# Patient Record
Sex: Female | Born: 1975 | Race: White | Hispanic: No | Smoking: Never smoker
Health system: Southern US, Community
[De-identification: ages and names within clinical notes are randomized; demographics above are authoritative.]

## PROBLEM LIST (undated history)

## (undated) DIAGNOSIS — I10 Essential (primary) hypertension: Secondary | ICD-10-CM

## (undated) DIAGNOSIS — G5602 Carpal tunnel syndrome, left upper limb: Secondary | ICD-10-CM

## (undated) DIAGNOSIS — B001 Herpesviral vesicular dermatitis: Secondary | ICD-10-CM

## (undated) HISTORY — DX: Carpal tunnel syndrome, left upper limb: G56.02

## (undated) HISTORY — DX: Essential (primary) hypertension: I10

## (undated) HISTORY — DX: Herpesviral vesicular dermatitis: B00.1

## (undated) HISTORY — PX: CARPAL TUNNEL RELEASE: SHX101

---

## 1997-08-04 ENCOUNTER — Other Ambulatory Visit: Admission: RE | Admit: 1997-08-04 | Discharge: 1997-08-04 | Payer: Self-pay | Admitting: *Deleted

## 1998-08-10 ENCOUNTER — Other Ambulatory Visit: Admission: RE | Admit: 1998-08-10 | Discharge: 1998-08-10 | Payer: Self-pay | Admitting: Obstetrics and Gynecology

## 1999-11-28 ENCOUNTER — Other Ambulatory Visit: Admission: RE | Admit: 1999-11-28 | Discharge: 1999-11-28 | Payer: Self-pay | Admitting: Obstetrics and Gynecology

## 2001-01-10 ENCOUNTER — Other Ambulatory Visit: Admission: RE | Admit: 2001-01-10 | Discharge: 2001-01-10 | Payer: Self-pay | Admitting: Obstetrics and Gynecology

## 2002-01-23 ENCOUNTER — Other Ambulatory Visit: Admission: RE | Admit: 2002-01-23 | Discharge: 2002-01-23 | Payer: Self-pay | Admitting: Obstetrics and Gynecology

## 2003-01-30 ENCOUNTER — Other Ambulatory Visit: Admission: RE | Admit: 2003-01-30 | Discharge: 2003-01-30 | Payer: Self-pay | Admitting: Obstetrics and Gynecology

## 2004-04-05 ENCOUNTER — Other Ambulatory Visit: Admission: RE | Admit: 2004-04-05 | Discharge: 2004-04-05 | Payer: Self-pay | Admitting: Obstetrics and Gynecology

## 2005-04-14 ENCOUNTER — Other Ambulatory Visit: Admission: RE | Admit: 2005-04-14 | Discharge: 2005-04-14 | Payer: Self-pay | Admitting: Obstetrics and Gynecology

## 2009-06-29 ENCOUNTER — Inpatient Hospital Stay (HOSPITAL_COMMUNITY): Admission: AD | Admit: 2009-06-29 | Discharge: 2009-07-02 | Payer: Self-pay | Admitting: Obstetrics and Gynecology

## 2010-06-08 LAB — CBC
HCT: 28.8 % — ABNORMAL LOW (ref 36.0–46.0)
HCT: 34.8 % — ABNORMAL LOW (ref 36.0–46.0)
Hemoglobin: 11.7 g/dL — ABNORMAL LOW (ref 12.0–15.0)
MCV: 86.9 fL (ref 78.0–100.0)
Platelets: 291 10*3/uL (ref 150–400)
RBC: 3.31 MIL/uL — ABNORMAL LOW (ref 3.87–5.11)
WBC: 15 10*3/uL — ABNORMAL HIGH (ref 4.0–10.5)
WBC: 15.1 10*3/uL — ABNORMAL HIGH (ref 4.0–10.5)

## 2010-06-08 LAB — COMPREHENSIVE METABOLIC PANEL
BUN: 8 mg/dL (ref 6–23)
Chloride: 106 mEq/L (ref 96–112)
GFR calc non Af Amer: >60 mL/min (ref >60)
Glucose, Bld: 71 mg/dL (ref 70–99)
Potassium: 3.9 mEq/L (ref 3.5–5.1)

## 2010-06-08 LAB — RPR: RPR Ser Ql: NONREACTIVE

## 2014-06-25 ENCOUNTER — Other Ambulatory Visit: Payer: Self-pay | Admitting: Obstetrics and Gynecology

## 2014-06-25 DIAGNOSIS — R928 Other abnormal and inconclusive findings on diagnostic imaging of breast: Secondary | ICD-10-CM

## 2014-07-02 ENCOUNTER — Ambulatory Visit
Admission: RE | Admit: 2014-07-02 | Discharge: 2014-07-02 | Disposition: A | Discharge status: Home / Self Care | Payer: Federal, State, Local not specified - PPO | Source: Ambulatory Visit | Attending: Obstetrics and Gynecology | Admitting: Obstetrics and Gynecology

## 2014-07-02 DIAGNOSIS — R928 Other abnormal and inconclusive findings on diagnostic imaging of breast: Secondary | ICD-10-CM

## 2017-07-23 ENCOUNTER — Other Ambulatory Visit: Payer: Self-pay | Admitting: Obstetrics and Gynecology

## 2017-07-23 DIAGNOSIS — R928 Other abnormal and inconclusive findings on diagnostic imaging of breast: Secondary | ICD-10-CM

## 2017-07-24 ENCOUNTER — Ambulatory Visit
Admission: RE | Admit: 2017-07-24 | Discharge: 2017-07-24 | Disposition: A | Discharge status: Home / Self Care | Payer: Federal, State, Local not specified - PPO | Source: Ambulatory Visit | Attending: Obstetrics and Gynecology | Admitting: Obstetrics and Gynecology

## 2017-07-24 DIAGNOSIS — R928 Other abnormal and inconclusive findings on diagnostic imaging of breast: Secondary | ICD-10-CM

## 2019-02-26 ENCOUNTER — Ambulatory Visit: Payer: Federal, State, Local not specified - PPO | Admitting: Neurology

## 2019-02-26 ENCOUNTER — Other Ambulatory Visit: Payer: Self-pay

## 2019-02-26 ENCOUNTER — Ambulatory Visit (INDEPENDENT_AMBULATORY_CARE_PROVIDER_SITE_OTHER): Payer: Federal, State, Local not specified - PPO | Admitting: Neurology

## 2019-02-26 ENCOUNTER — Encounter: Payer: Self-pay | Admitting: Neurology

## 2019-02-26 DIAGNOSIS — G5601 Carpal tunnel syndrome, right upper limb: Secondary | ICD-10-CM

## 2019-02-26 HISTORY — DX: Carpal tunnel syndrome, right upper limb: G56.01

## 2019-02-26 NOTE — Procedures (Signed)
     HISTORY:  Jeanette Todd is a 43 year old patient with a several year history of some numbness in the hands that may wake her up at night.  She has symptoms on both sides, a bit more significant on the right than the left.  She does have a history of chronic neck pain and neck stiffness.  She denies any radicular pain down the arm on either side.  She is being evaluated for possible neuropathy or a cervical radiculopathy.  NERVE CONDUCTION STUDIES:  Nerve conduction studies were performed on both upper extremities.  The distal motor latencies for the median nerves were normal on the left and borderline normal on the right with normal motor amplitudes for these nerves bilaterally.  The distal motor latencies and motor amplitudes for the ulnar nerves were normal bilaterally with normal nerve conduction velocities seen for the median and ulnar nerves bilaterally.  The sensory latencies for the median nerves were prolonged on the right, normal on the left and normal for the ulnar nerves bilaterally.  The F-wave latencies for the ulnar nerves were normal bilaterally.  EMG STUDIES:  EMG study was performed on the left upper extremity:  The first dorsal interosseous muscle reveals 2 to 4 K units with full recruitment. No fibrillations or positive waves were noted. The abductor pollicis brevis muscle reveals 2 to 4 K units with full recruitment. No fibrillations or positive waves were noted. The extensor indicis proprius muscle reveals 1 to 3 K units with full recruitment. No fibrillations or positive waves were noted. The pronator teres muscle reveals 2 to 3 K units with full recruitment. No fibrillations or positive waves were noted. The biceps muscle reveals 1 to 2 K units with full recruitment. No fibrillations or positive waves were noted. The triceps muscle reveals 2 to 4 K units with full recruitment. No fibrillations or positive waves were noted. The anterior deltoid muscle reveals 2 to 3 K  units with full recruitment. No fibrillations or positive waves were noted. The cervical paraspinal muscles were tested at 2 levels. No abnormalities of insertional activity were seen at either level tested. There was fair relaxation.   IMPRESSION:  Nerve conduction studies done on both upper extremities shows evidence of a mild/borderline right carpal tunnel syndrome.  The study on the left upper extremity was unremarkable.  EMG evaluation of the left upper extremity shows no significant abnormalities, there is no evidence of an overlying cervical radiculopathy.  Jill Alexanders MD 02/26/2019 4:14 PM  [facility] [address] [place], [place] [address]  Phone [phone] Fax [phone]

## 2019-02-26 NOTE — Progress Notes (Signed)
Please refer to EMG and nerve conduction procedure note.  

## 2021-01-13 ENCOUNTER — Ambulatory Visit (HOSPITAL_BASED_OUTPATIENT_CLINIC_OR_DEPARTMENT_OTHER): Payer: Federal, State, Local not specified - PPO | Admitting: Cardiovascular Disease

## 2021-01-13 ENCOUNTER — Encounter (HOSPITAL_BASED_OUTPATIENT_CLINIC_OR_DEPARTMENT_OTHER): Payer: Self-pay | Admitting: Cardiovascular Disease

## 2021-01-13 ENCOUNTER — Other Ambulatory Visit: Payer: Self-pay

## 2021-01-13 DIAGNOSIS — I10 Essential (primary) hypertension: Secondary | ICD-10-CM

## 2021-01-13 DIAGNOSIS — R002 Palpitations: Secondary | ICD-10-CM | POA: Diagnosis not present

## 2021-01-13 DIAGNOSIS — Z8249 Family history of ischemic heart disease and other diseases of the circulatory system: Secondary | ICD-10-CM | POA: Diagnosis not present

## 2021-01-13 HISTORY — DX: Palpitations: R00.2

## 2021-01-13 HISTORY — DX: Essential (primary) hypertension: I10

## 2021-01-13 NOTE — Assessment & Plan Note (Addendum)
Symptoms were worse prior to adding lisinopril.  Given that she has no lung symptoms, we will not get an ambulatory monitor at this time.  We will reassess if her episodes become more frequent again.  Labs from her PCP were reviewed and unremarkable.  Therefore no plans to repeat at this time.

## 2021-01-13 NOTE — Progress Notes (Signed)
Cardiology Office Note:    Date:  01/18/2021   ID:  Jeanette Todd, DOB 11/06/1975, MRN [id number]  PCP:  Mitzi Hansen, NP   [facility] Providers Cardiologist:  None     Referring MD: Mitzi Hansen, NP   No chief complaint on file.   History of Present Illness:    Jeanette Todd is a 45 y.o. female with a hx of anxiety, and hypertension, here for evaluation of palpitations.  She saw Cannon Kettle, NP on 10/2020 and reported episodes of palpitaions. At that time her BP 154/103. Lisinopril was added to her regimen, and she was referred to cardiology.  Today, she reports having palpitations and shortness of breath that she attributes to her prior COVID infection. Her palpitations occur randomly at rest or with exertion. Does not check blood pressure everyday, but when she does it is generally stable. Since her medication changes 2 months ago, her blood pressure has improved with averages around 120/90, and her palpitations have not recurred. Additionally, she notes improvement in her ankle edema since beginning lisinopril. She reports having random shortness of breath that does not necessarily occur with exercise. It does usually occur by the time she climbs a flight of stairs. Has not been able to exercise due to her severe lower back pain. Used to do yoga, and enjoys walking and hiking, but dislikes the gym. She does not smoke. For her diet, she reports eating very well, and avoids salt. Does not drink soft drinks or tea/coffee. She denies any chest pain. No lightheadedness, headaches, orthopnea, or PND.    Past Medical History:  Diagnosis Date   Essential hypertension 01/13/2021   Family history of premature CAD 01/18/2021   HTN (hypertension)    Left carpal tunnel syndrome    Palpitations 01/13/2021   Right carpal tunnel syndrome 02/26/2019    History reviewed. No pertinent surgical history.  Current Medications: Current Meds  Medication Sig   hydrochlorothiazide (HYDRODIURIL)  25 MG tablet Take 1 tablet by mouth daily.   levonorgestrel (MIRENA, 52 MG,) 20 MCG/DAY IUD Mirena 20 mcg/24 hours (8 yrs) 52 mg intrauterine device  Take 1 device by intrauterine route.   lisinopril (ZESTRIL) 10 MG tablet Take 10 mg by mouth daily.   LITHIUM OROTATE PO Take 1 tablet by mouth daily.   Probiotic Product (PROBIOTIC DAILY PO) Take 2 tablets by mouth daily.     Allergies:   Patient has no allergy information on record.   Social History   Socioeconomic History   Marital status: Married    Spouse name: Not on file   Number of children: Not on file   Years of education: Not on file   Highest education level: Not on file  Occupational History   Not on file  Tobacco Use   Smoking status: Never   Smokeless tobacco: Never  Substance and Sexual Activity   Alcohol use: Yes    Comment: Occassionally   Drug use: Never   Sexual activity: Not on file  Other Topics Concern   Not on file  Social History Narrative   Not on file   Social Determinants of Health   Financial Resource Strain: Low Risk    Difficulty of Paying Living Expenses: Not hard at all  Food Insecurity: No Food Insecurity   Worried About Running Out of Food in the Last Year: Never true   Ran Out of Food in the Last Year: Never true  Transportation Needs: No Transportation Needs   Lack  of Transportation (Medical): No   Lack of Transportation (Non-Medical): No  Physical Activity: Inactive   Days of Exercise per Week: 0 days   Minutes of Exercise per Session: 0 min  Stress: Not on file  Social Connections: Not on file     Family History: The patient's family history includes Coronary artery disease in her father; Diabetes in her father; Heart attack in her paternal grandfather; Heart disease in her father, maternal grandfather, and maternal grandmother; Hypertension in her mother.  ROS:   Please see the history of present illness.    (+) Shortness of breath (+) Palpitations (+) Lower back pain All  other systems reviewed and are negative.  EKGs/Labs/Other Studies Reviewed:    The following studies were reviewed today: No prior cardiovascular studies available.  EKG:    01/13/2021: Sinus rhythm. Rate 85 bpm.  Recent Labs: No results found for requested labs within last 8760 hours.  Recent Lipid Panel No results found for: CHOL, TRIG, HDL, CHOLHDL, VLDL, LDLCALC, LDLDIRECT   Physical Exam:    Wt Readings from Last 3 Encounters:  01/13/21 188 lb 8 oz (85.5 kg)     VS:  BP 118/81 (BP Location: Right Arm, Patient Position: Sitting, Cuff Size: Large)   Pulse 85   Ht 5\' 6"  (1.676 m)   Wt 188 lb 8 oz (85.5 kg)   BMI 30.42 kg/m  , BMI Body mass index is 30.42 kg/m. GENERAL:  Well appearing HEENT: Pupils equal round and reactive, fundi not visualized, oral mucosa unremarkable NECK:  No jugular venous distention, waveform within normal limits, carotid upstroke brisk and symmetric, no bruits, no thyromegaly LUNGS:  Clear to auscultation bilaterally HEART:  RRR.  PMI not displaced or sustained,S1 and S2 within normal limits, no S3, no S4, no clicks, no rubs, no murmurs ABD:  Flat, positive bowel sounds normal in frequency in pitch, no bruits, no rebound, no guarding, no midline pulsatile mass, no hepatomegaly, no splenomegaly EXT:  2 plus pulses throughout, no edema, no cyanosis no clubbing SKIN:  No rashes no nodules NEURO:  Cranial nerves II through XII grossly intact, motor grossly intact throughout PSYCH:  Cognitively intact, oriented to person place and time   ASSESSMENT:    1. Essential hypertension   2. Palpitations   3. Family history of premature CAD    PLAN:    Essential hypertension BP better since adding lisinopril to HCTZ.  Palpitations Symptoms were worse prior to adding lisinopril.  Given that she has no lung symptoms, we will not get an ambulatory monitor at this time.  We will reassess if her episodes become more frequent again.  Labs from her PCP were  reviewed and unremarkable.  Therefore no plans to repeat at this time.  Family history of premature CAD She is interested in getting a coronary calcium score.     Disposition: FU with Courteny Egler C. , MD, [facility]  PRN.  Medication Adjustments/Labs and Tests Ordered: Current medicines are reviewed at length with the patient today.  Concerns regarding medicines are outlined above.   Orders Placed This Encounter  Procedures   CT CARDIAC SCORING (SELF PAY ONLY)   EKG 12-Lead    No orders of the defined types were placed in this encounter.   Patient Instructions  Medication Instructions:  Your physician recommends that you continue on your current medications as directed. Please refer to the Current Medication list given to you today.  Labwork: NONE  Testing/Procedures: CALCIUM SCORE- THIS WILL COST $99  OUT OF POCKET  Follow-Up: AS NEEDED          I,Mathew Stumpf,acting as a scribe for Chilton Si, MD.,have documented all relevant documentation on the behalf of Chilton Si, MD,as directed by  Chilton Si, MD while in the presence of Chilton Si, MD.   I, Nichoel Digiulio C. Duke Salvia, MD have reviewed all documentation for this visit.  The documentation of the exam, diagnosis, procedures, and orders on 01/18/2021 are all accurate and complete.   Signed, Chilton Si, MD  01/18/2021 3:27 PM    [facility]

## 2021-01-13 NOTE — Assessment & Plan Note (Signed)
BP better since adding lisinopril to HCTZ.

## 2021-01-13 NOTE — Patient Instructions (Signed)
Medication Instructions:  Your physician recommends that you continue on your current medications as directed. Please refer to the Current Medication list given to you today.  Labwork: NONE  Testing/Procedures: CALCIUM SCORE- THIS WILL COST $99 OUT OF POCKET  Follow-Up: AS NEEDED

## 2021-01-17 ENCOUNTER — Encounter (HOSPITAL_BASED_OUTPATIENT_CLINIC_OR_DEPARTMENT_OTHER): Payer: Self-pay

## 2021-01-18 ENCOUNTER — Encounter (HOSPITAL_BASED_OUTPATIENT_CLINIC_OR_DEPARTMENT_OTHER): Payer: Self-pay | Admitting: Cardiovascular Disease

## 2021-01-18 DIAGNOSIS — Z8249 Family history of ischemic heart disease and other diseases of the circulatory system: Secondary | ICD-10-CM | POA: Insufficient documentation

## 2021-01-18 HISTORY — DX: Family history of ischemic heart disease and other diseases of the circulatory system: Z82.49

## 2021-01-18 NOTE — Assessment & Plan Note (Signed)
She is interested in getting a coronary calcium score.

## 2021-02-09 ENCOUNTER — Ambulatory Visit
Admission: RE | Admit: 2021-02-09 | Discharge: 2021-02-09 | Disposition: A | Discharge status: Home / Self Care | Payer: Self-pay | Source: Ambulatory Visit | Attending: Cardiovascular Disease | Admitting: Cardiovascular Disease

## 2021-02-09 ENCOUNTER — Other Ambulatory Visit: Payer: Self-pay

## 2021-02-09 DIAGNOSIS — R002 Palpitations: Secondary | ICD-10-CM

## 2021-02-09 DIAGNOSIS — I10 Essential (primary) hypertension: Secondary | ICD-10-CM

## 2021-04-26 DIAGNOSIS — N393 Stress incontinence (female) (male): Secondary | ICD-10-CM | POA: Diagnosis not present

## 2021-04-26 DIAGNOSIS — M6289 Other specified disorders of muscle: Secondary | ICD-10-CM | POA: Diagnosis not present

## 2021-04-26 DIAGNOSIS — M6281 Muscle weakness (generalized): Secondary | ICD-10-CM | POA: Diagnosis not present

## 2021-07-18 DIAGNOSIS — I10 Essential (primary) hypertension: Secondary | ICD-10-CM | POA: Diagnosis not present

## 2021-07-18 DIAGNOSIS — M545 Low back pain, unspecified: Secondary | ICD-10-CM | POA: Diagnosis not present

## 2021-10-20 DIAGNOSIS — Z23 Encounter for immunization: Secondary | ICD-10-CM | POA: Diagnosis not present

## 2021-10-20 DIAGNOSIS — Z13220 Encounter for screening for lipoid disorders: Secondary | ICD-10-CM | POA: Diagnosis not present

## 2021-10-20 DIAGNOSIS — Z0000 Encounter for general adult medical examination without abnormal findings: Secondary | ICD-10-CM | POA: Diagnosis not present

## 2022-01-12 DIAGNOSIS — K648 Other hemorrhoids: Secondary | ICD-10-CM | POA: Diagnosis not present

## 2022-01-12 DIAGNOSIS — Z1211 Encounter for screening for malignant neoplasm of colon: Secondary | ICD-10-CM | POA: Diagnosis not present

## 2022-01-12 DIAGNOSIS — D123 Benign neoplasm of transverse colon: Secondary | ICD-10-CM | POA: Diagnosis not present

## 2022-01-31 ENCOUNTER — Ambulatory Visit (INDEPENDENT_AMBULATORY_CARE_PROVIDER_SITE_OTHER): Payer: Federal, State, Local not specified - PPO | Admitting: Radiology

## 2022-01-31 ENCOUNTER — Encounter: Payer: Self-pay | Admitting: Radiology

## 2022-01-31 VITALS — BP 114/84 | Ht 65.5 in | Wt 179.0 lb

## 2022-01-31 DIAGNOSIS — Z01419 Encounter for gynecological examination (general) (routine) without abnormal findings: Secondary | ICD-10-CM | POA: Diagnosis not present

## 2022-01-31 DIAGNOSIS — Z30431 Encounter for routine checking of intrauterine contraceptive device: Secondary | ICD-10-CM

## 2022-01-31 NOTE — Progress Notes (Signed)
   Jeanette Todd Dec 17, 1975 [id number]   History:  46 y.o. G1P1 presents for annual exam. No gyn concerns.   Gynecologic History No LMP recorded. (Menstrual status: IUD).   Contraception/Family planning: IUD Sexually active: yes Last Pap: 2019. Results were: normal Last mammogram: 11/22. Results were: normal  Obstetric History OB History  Gravida Para Term Preterm AB Living  1 1       1   SAB IAB Ectopic Multiple Live Births               # Outcome Date GA Lbr Len/2nd Weight Sex Delivery Anes PTL Lv  1 Para              The following portions of the patient's history were reviewed and updated as appropriate: allergies, current medications, past family history, past medical history, past social history, past surgical history, and problem list.  Review of Systems Pertinent items noted in HPI and remainder of comprehensive ROS otherwise negative.   Past medical history, past surgical history, family history and social history were all reviewed and documented in the EPIC chart.   Exam:  Vitals:   01/31/22 0828  BP: 114/84  Weight: 179 lb (81.2 kg)  Height: 5' 5.5" (1.664 m)   Body mass index is 29.33 kg/m.  General appearance:  Normal Thyroid:  Symmetrical, normal in size, without palpable masses or nodularity. Respiratory  Auscultation:  Clear without wheezing or rhonchi Cardiovascular  Auscultation:  Regular rate, without rubs, murmurs or gallops  Edema/varicosities:  Not grossly evident Abdominal  Soft,nontender, without masses, guarding or rebound.  Liver/spleen:  No organomegaly noted  Hernia:  None appreciated  Skin  Inspection:  Grossly normal Breasts: Examined lying and sitting.   Right: Without masses, retractions, nipple discharge or axillary adenopathy.   Left: Without masses, retractions, nipple discharge or axillary adenopathy. Genitourinary   Inguinal/mons:  Normal without inguinal adenopathy  External genitalia:  Normal appearing vulva with no  masses, tenderness, or lesions  BUS/Urethra/Skene's glands:  Normal without masses or exudate  Vagina:  Normal appearing with normal color and discharge, no lesions  Cervix:  Normal appearing without discharge or lesions  Uterus:  Normal in size, shape and contour.  Mobile, nontender  Adnexa/parametria:     Rt: Normal in size, without masses or tenderness.   Lt: Normal in size, without masses or tenderness.  Anus and perineum: Normal, hemorrhoids   Patient informed chaperone available to be present for breast and pelvic exam. Patient has requested no chaperone to be present. Patient has been advised what will be completed during breast and pelvic exam.   Assessment/Plan:   1. Well woman exam with routine gynecological exam Pap due 2024  2. IUD check up Reassured strings seen    Discussed SBE, colonoscopy and DEXA screening as directed/appropriate. Recommend 2025 of exercise weekly, including weight bearing exercise. Encouraged the use of seatbelts and sunscreen. Return in 1 year for annual or as needed.   WHNP-BC 8:49 AM 01/31/2022

## 2022-02-01 ENCOUNTER — Other Ambulatory Visit: Payer: Self-pay | Admitting: Radiology

## 2022-02-01 DIAGNOSIS — Z1231 Encounter for screening mammogram for malignant neoplasm of breast: Secondary | ICD-10-CM

## 2022-03-22 ENCOUNTER — Ambulatory Visit
Admission: RE | Admit: 2022-03-22 | Discharge: 2022-03-22 | Disposition: A | Discharge status: Home / Self Care | Payer: Federal, State, Local not specified - PPO | Source: Ambulatory Visit | Attending: Radiology | Admitting: Radiology

## 2022-03-22 DIAGNOSIS — Z1231 Encounter for screening mammogram for malignant neoplasm of breast: Secondary | ICD-10-CM | POA: Diagnosis not present

## 2022-04-28 DIAGNOSIS — J069 Acute upper respiratory infection, unspecified: Secondary | ICD-10-CM | POA: Diagnosis not present

## 2022-10-25 DIAGNOSIS — E785 Hyperlipidemia, unspecified: Secondary | ICD-10-CM | POA: Diagnosis not present

## 2022-10-25 DIAGNOSIS — I10 Essential (primary) hypertension: Secondary | ICD-10-CM | POA: Diagnosis not present

## 2022-10-25 DIAGNOSIS — Z0000 Encounter for general adult medical examination without abnormal findings: Secondary | ICD-10-CM | POA: Diagnosis not present

## 2023-02-06 ENCOUNTER — Encounter: Payer: Self-pay | Admitting: Radiology

## 2023-02-06 ENCOUNTER — Other Ambulatory Visit (HOSPITAL_COMMUNITY)
Admission: RE | Admit: 2023-02-06 | Discharge: 2023-02-06 | Disposition: A | Discharge status: Home / Self Care | Payer: Federal, State, Local not specified - PPO | Source: Ambulatory Visit | Attending: Radiology | Admitting: Radiology

## 2023-02-06 ENCOUNTER — Ambulatory Visit (INDEPENDENT_AMBULATORY_CARE_PROVIDER_SITE_OTHER): Payer: Federal, State, Local not specified - PPO | Admitting: Radiology

## 2023-02-06 VITALS — BP 116/82 | Ht 65.0 in | Wt 152.0 lb

## 2023-02-06 DIAGNOSIS — Z01419 Encounter for gynecological examination (general) (routine) without abnormal findings: Secondary | ICD-10-CM

## 2023-02-06 DIAGNOSIS — B009 Herpesviral infection, unspecified: Secondary | ICD-10-CM | POA: Diagnosis not present

## 2023-02-06 DIAGNOSIS — F419 Anxiety disorder, unspecified: Secondary | ICD-10-CM

## 2023-02-06 DIAGNOSIS — K648 Other hemorrhoids: Secondary | ICD-10-CM | POA: Diagnosis not present

## 2023-02-06 MED ORDER — HYDROCORTISONE ACETATE 25 MG RE SUPP: 25.0000 mg | Freq: Two times a day (BID) | RECTAL | 4 refills | Status: AC | PRN

## 2023-02-06 MED ORDER — VALACYCLOVIR HCL 500 MG PO TABS: 500.0000 mg | ORAL_TABLET | Freq: Every day | ORAL | 4 refills | Status: DC

## 2023-02-06 NOTE — Progress Notes (Signed)
Jeanette Todd [birth year] [id number]   History:  47 y.o. G1P1 presents for annual exam. Legally separated, has a new sexual partner, declines STI screen. C/o worsening oral HSV outbreaks, worse during stressful times. Also has a hemorrhoid that flares on and off. Denies any constipation or straining.  Gynecologic History No LMP recorded. (Menstrual status: IUD).   Contraception/Family planning: IUD Mirena placed 2022 Sexually active: yes Last Pap: 2019. Results were: normal Last mammogram: 1/24. Results were: normal  Obstetric History OB History  Gravida Para Term Preterm AB Living  1 1       1   SAB IAB Ectopic Multiple Live Births               # Outcome Date GA Lbr Len/2nd Weight Sex Type Anes PTL Lv  1 Para             The following portions of the patient's history were reviewed and updated as appropriate: allergies, current medications, past family history, past medical history, past social history, past surgical history, and problem list.  Review of Systems  All other systems reviewed and are negative.   Past medical history, past surgical history, family history and social history were all reviewed and documented in the EPIC chart.  Exam:  Vitals:   02/06/23 0959  BP: 116/82  Weight: 152 lb (68.9 kg)  Height: 5\' 5"  (1.651 m)   Body mass index is 25.29 kg/m.  Physical Exam Vitals and nursing note reviewed. Exam conducted with a chaperone present.  Constitutional:      Appearance: Normal appearance. She is normal weight.  HENT:     Head: Normocephalic and atraumatic.  Neck:     Thyroid: No thyroid mass, thyromegaly or thyroid tenderness.  Cardiovascular:     Rate and Rhythm: Regular rhythm.     Heart sounds: Normal heart sounds.  Pulmonary:     Effort: Pulmonary effort is normal.     Breath sounds: Normal breath sounds.  Chest:  Breasts:    Breasts are symmetrical.     Right: Normal. No inverted nipple, mass, nipple discharge, skin change or  tenderness.     Left: Normal. No inverted nipple, mass, nipple discharge, skin change or tenderness.  Abdominal:     General: Abdomen is flat. Bowel sounds are normal.     Palpations: Abdomen is soft.  Genitourinary:    General: Normal vulva.     Vagina: Normal. No vaginal discharge, bleeding or lesions.     Cervix: Normal. No discharge or lesion.     Uterus: Normal. Not enlarged and not tender.      Adnexa: Right adnexa normal and left adnexa normal.       Right: No mass, tenderness or fullness.         Left: No mass, tenderness or fullness.       Rectum: External hemorrhoid and internal hemorrhoid present. No tenderness.  Lymphadenopathy:     Upper Body:     Right upper body: No axillary adenopathy.     Left upper body: No axillary adenopathy.  Skin:    General: Skin is warm and dry.  Neurological:     Mental Status: She is alert and oriented to person, place, and time.  Psychiatric:        Mood and Affect: Mood normal.        Thought Content: Thought content normal.        Judgment: Judgment normal.      Raynelle Fanning,  CMA present for exam  Assessment/Plan:   1. Well woman exam with routine gynecological exam - Cytology - PAP( [place])  2. HSV-1 infection - valACYclovir (VALTREX) 500 MG tablet; Take 1 tablet (500 mg total) by mouth daily.  Dispense: 90 tablet; Refill: 4  3. Hemorrhoid prolapse - hydrocortisone (ANUSOL-HC) 25 MG suppository; Place 1 suppository (25 mg total) rectally 2 (two) times daily as needed for hemorrhoids or anal itching.  Dispense: 12 suppository; Refill: 4  4. Anxiety Increased anxiety, will continue to monitor. 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 Declines medication at this time. May benefit from working remotely full time.     Discussed SBE, pap and mammogram screening as directed/appropriate. Recommend of exercise weekly, including weight bearing exercise. Encouraged the use of sunscreen and seeing  a dermatologist for a skin check.  Return in about 1 year (around 02/06/2024) for Annual.  Tanda Rockers WHNP-BC 10:39 AM 02/06/2023

## 2023-02-08 LAB — CYTOLOGY - PAP
Adequacy: ABSENT
Comment: NEGATIVE
Diagnosis: NEGATIVE
High risk HPV: NEGATIVE

## 2023-02-12 NOTE — Telephone Encounter (Signed)
Routing to [employer] to review and advise on accommodations.

## 2023-03-02 ENCOUNTER — Other Ambulatory Visit: Payer: Self-pay | Admitting: Radiology

## 2023-03-02 DIAGNOSIS — Z1231 Encounter for screening mammogram for malignant neoplasm of breast: Secondary | ICD-10-CM

## 2023-03-27 DIAGNOSIS — Z1283 Encounter for screening for malignant neoplasm of skin: Secondary | ICD-10-CM | POA: Diagnosis not present

## 2023-03-27 DIAGNOSIS — D225 Melanocytic nevi of trunk: Secondary | ICD-10-CM | POA: Diagnosis not present

## 2023-03-30 ENCOUNTER — Ambulatory Visit
Admission: RE | Admit: 2023-03-30 | Discharge: 2023-03-30 | Disposition: A | Discharge status: Home / Self Care | Payer: Federal, State, Local not specified - PPO | Source: Ambulatory Visit | Attending: Radiology | Admitting: Radiology

## 2023-03-30 DIAGNOSIS — Z1231 Encounter for screening mammogram for malignant neoplasm of breast: Secondary | ICD-10-CM

## 2023-04-20 IMAGING — CT CT CARDIAC CORONARY ARTERY CALCIUM SCORE
3 series · 14 of 20 positions shown, 16 images · non-contrast
Comparison: None.
COMPARISON: None.

Addendum:
EXAM:
OVER-READ INTERPRETATION  CT CHEST

The following report is an over-read performed by radiologist Dr.
Jinna Awed [REDACTED] on 02/09/2021. This
over-read does not include interpretation of cardiac or coronary
anatomy or pathology. The coronary calcium score interpretation by
the cardiologist is attached.
TECHNIQUE: A gated, non-contrast computed tomography scan of the heart was
performed using 3mm slice thickness. Axial images were analyzed on a
dedicated workstation. Calcium scoring of the coronary arteries was
performed using the Agatston method.

[Series 2: cascseq 2.0 sa36 70% (id) · axial · 0.39mm/px · z∈[-273,-193]mm · 4 of 68 slices shown]
[im 14/68  vessel]
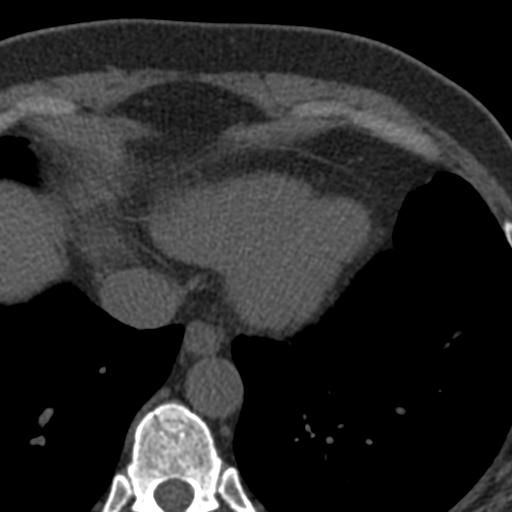
[im 27/68  vessel]
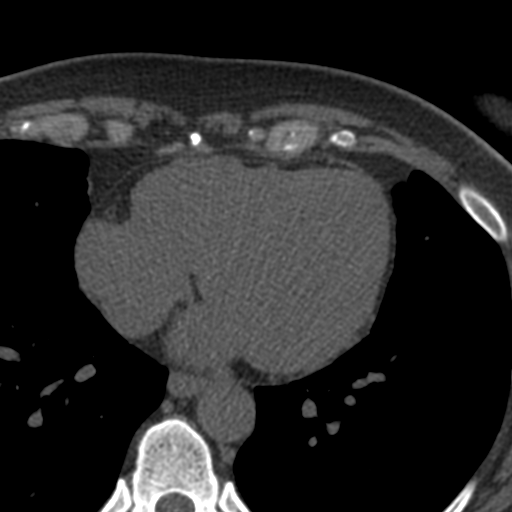
[im 41/68  vessel]
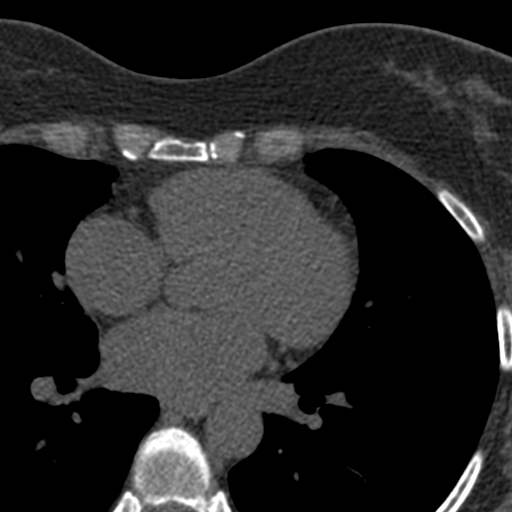
[im 54/68  vessel]
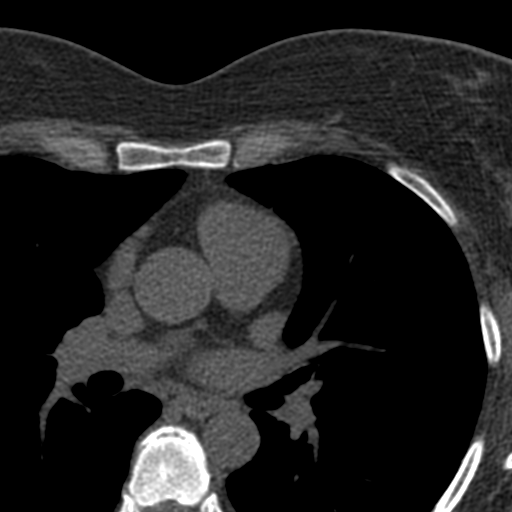

[Series 3: cascseq 2.0 bf37 st · axial · 0.73mm/px · z∈[-277,-189]mm · 5 of 68 slices shown, 7 images]
[im 12/68  vessel]
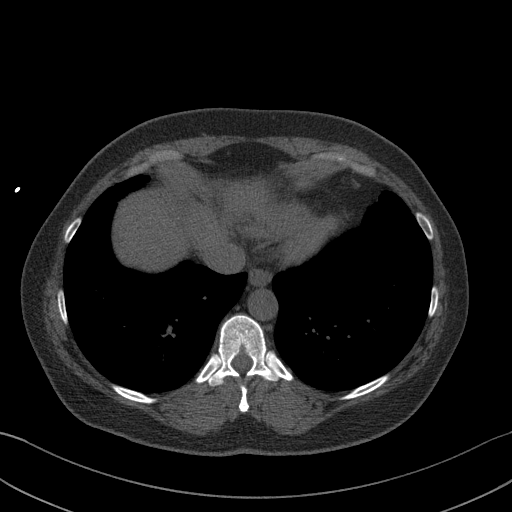
[im 12/68  lung]
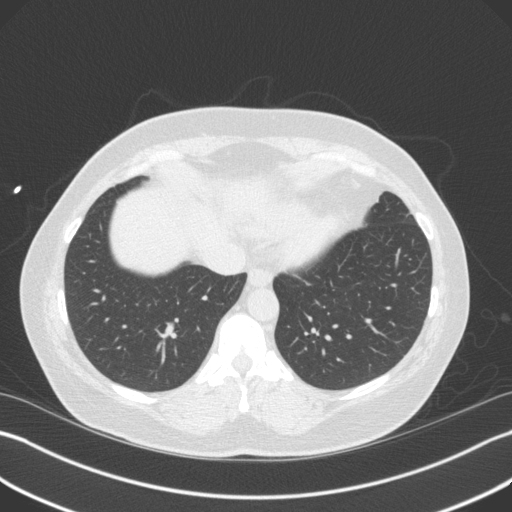
[im 23/68  vessel]
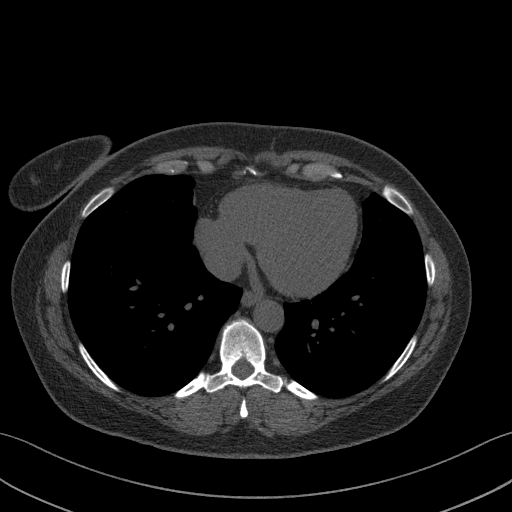
[im 34/68  vessel]
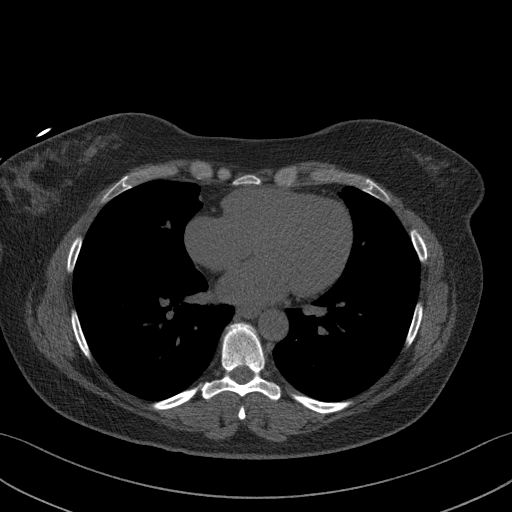
[im 45/68  vessel]
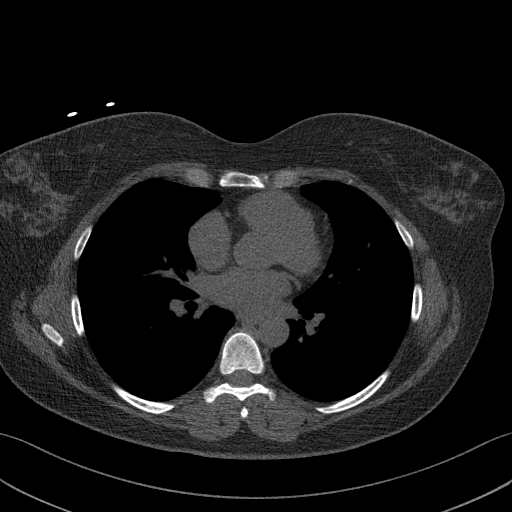
[im 56/68  vessel]
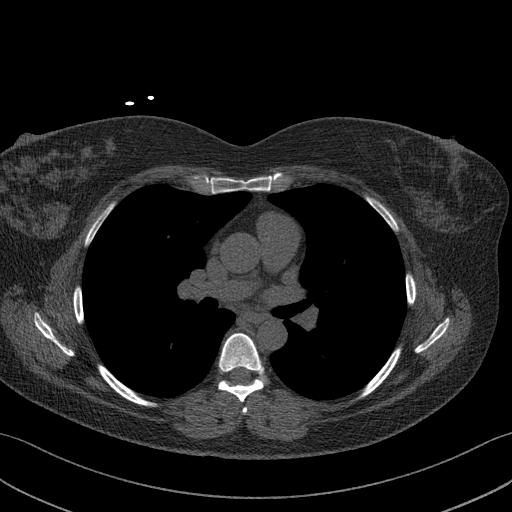
[im 56/68  lung]
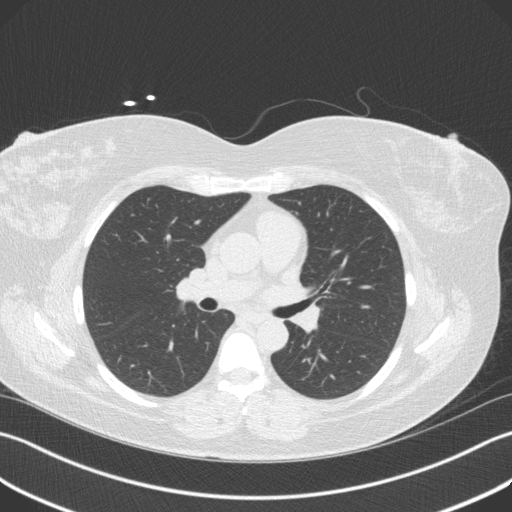

[Series 4: cascseq 2.0 br59 lung · axial · 0.65mm/px · z∈[-277,-189]mm · 5 of 68 slices shown]
[im 12/68  lung]
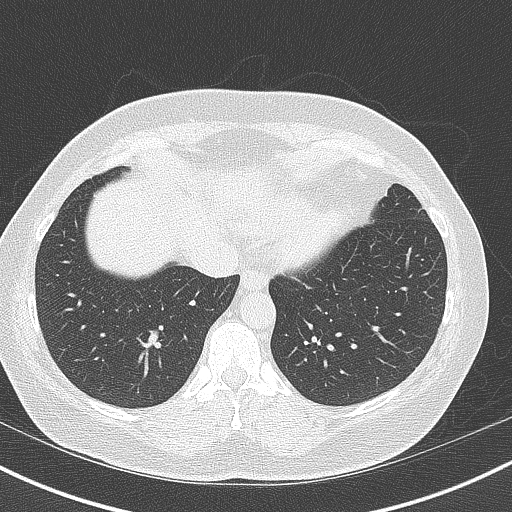
[im 23/68  lung]
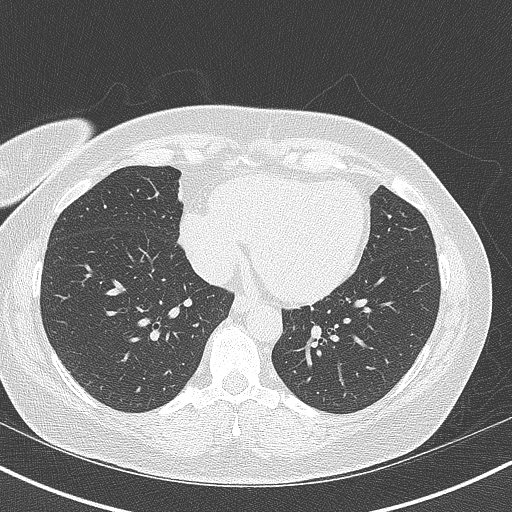
[im 34/68  lung]
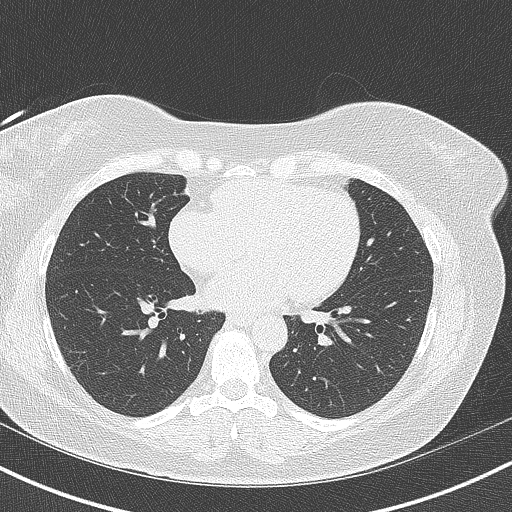
[im 45/68  lung]
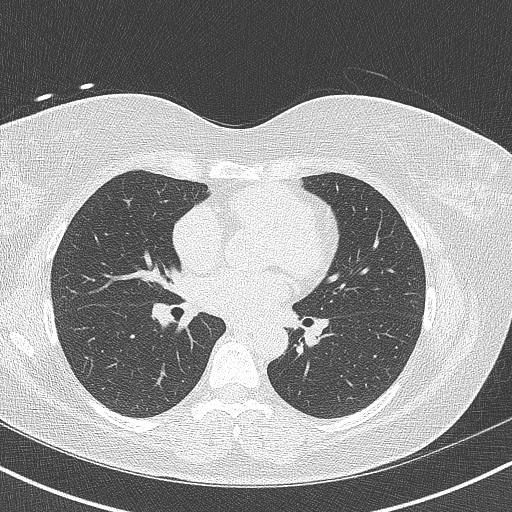
[im 56/68  lung]
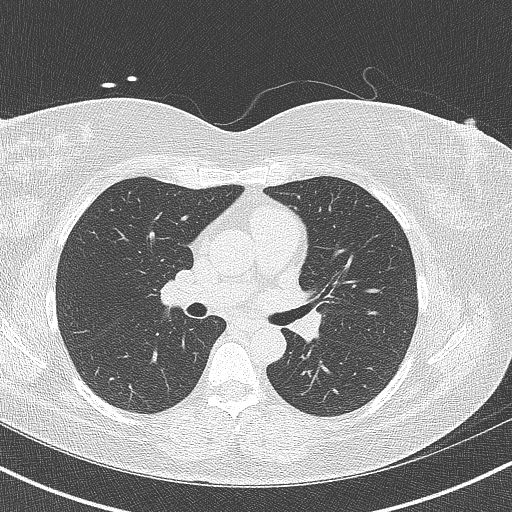

[14 of 20 positions shown; findings below may reference images not displayed]

FINDINGS: Vascular: Heart is normal size.  Aorta normal caliber

Mediastinum/Nodes: No adenopathy

Lungs/Pleura: No confluent opacities or effusions

Upper Abdomen: Imaging into the upper abdomen demonstrates no acute
findings.

Musculoskeletal: Chest wall soft tissues are unremarkable. No acute
bony abnormality.
IMPRESSION: No acute or significant extracardiac abnormality.

ADDENDUM:
Cardiovascular Disease Risk stratification

EXAM:
Coronary Calcium Score
FINDINGS: Coronary arteries: Normal origins.

Coronary Calcium Score:

Left main: 0

Left anterior descending artery: 0

Left circumflex artery: 0

Right coronary artery: 0

Total: 0

Percentile: 0

Pericardium: Normal.

Ascending Aorta: Normal caliber.

Non-cardiac: See separate report from [REDACTED].
IMPRESSION: Coronary calcium score of 0. This was 0 percentile for age-, race-,
and sex-matched controls.



If CAC=0, it is reasonable to withhold statin therapy and reassess
in 5 to 10 years, as long as higher risk conditions are absent
(diabetes mellitus, family history of premature CHD in first degree
relatives (males <55 years; females <65 years), cigarette smoking,
or LDL >=190 mg/dL).

If CAC is 1 to 99, it is reasonable to initiate statin therapy for
patients >=55 years of age.

If CAC is >=100 or >=75th percentile, it is reasonable to initiate
statin therapy at any age.

Cardiology referral should be considered for patients with CAC
scores >=400 or >=75th percentile.

*5237 AHA/ACC/AACVPR/AAPA/ABC/MALINAUSKAI/LUTFIJE/ADEARO/Jia/VALJAREVIC/EZEQUI/CEEJAY
Guideline on the Management of Blood Cholesterol: A Report of the
American College of Cardiology/American Heart Association Task Force
on Clinical Practice Guidelines. J Am Coll Cardiol.
7491;73(24):7731-7819.

Meet Gaxiola, DO

*** End of Addendum ***
EXAM:
OVER-READ INTERPRETATION  CT CHEST

The following report is an over-read performed by radiologist Dr.
Jinna Awed [REDACTED] on 02/09/2021. This
over-read does not include interpretation of cardiac or coronary
anatomy or pathology. The coronary calcium score interpretation by
the cardiologist is attached.
FINDINGS: Vascular: Heart is normal size.  Aorta normal caliber

Mediastinum/Nodes: No adenopathy

Lungs/Pleura: No confluent opacities or effusions

Upper Abdomen: Imaging into the upper abdomen demonstrates no acute
findings.

Musculoskeletal: Chest wall soft tissues are unremarkable. No acute
bony abnormality.
IMPRESSION: No acute or significant extracardiac abnormality.

## 2023-04-26 DIAGNOSIS — F5101 Primary insomnia: Secondary | ICD-10-CM | POA: Diagnosis not present

## 2023-04-26 DIAGNOSIS — F418 Other specified anxiety disorders: Secondary | ICD-10-CM | POA: Diagnosis not present

## 2023-06-18 DIAGNOSIS — F4323 Adjustment disorder with mixed anxiety and depressed mood: Secondary | ICD-10-CM | POA: Diagnosis not present

## 2023-07-18 DIAGNOSIS — F4323 Adjustment disorder with mixed anxiety and depressed mood: Secondary | ICD-10-CM | POA: Diagnosis not present

## 2023-08-18 DIAGNOSIS — F4323 Adjustment disorder with mixed anxiety and depressed mood: Secondary | ICD-10-CM | POA: Diagnosis not present

## 2023-10-26 DIAGNOSIS — E785 Hyperlipidemia, unspecified: Secondary | ICD-10-CM | POA: Diagnosis not present

## 2023-10-26 DIAGNOSIS — D126 Benign neoplasm of colon, unspecified: Secondary | ICD-10-CM | POA: Diagnosis not present

## 2023-10-26 DIAGNOSIS — I10 Essential (primary) hypertension: Secondary | ICD-10-CM | POA: Diagnosis not present

## 2023-10-26 DIAGNOSIS — L282 Other prurigo: Secondary | ICD-10-CM | POA: Diagnosis not present

## 2023-10-26 DIAGNOSIS — Z0000 Encounter for general adult medical examination without abnormal findings: Secondary | ICD-10-CM | POA: Diagnosis not present

## 2023-11-12 DIAGNOSIS — M545 Low back pain, unspecified: Secondary | ICD-10-CM | POA: Diagnosis not present

## 2023-11-23 DIAGNOSIS — M545 Low back pain, unspecified: Secondary | ICD-10-CM | POA: Diagnosis not present

## 2023-11-27 DIAGNOSIS — M47816 Spondylosis without myelopathy or radiculopathy, lumbar region: Secondary | ICD-10-CM | POA: Diagnosis not present

## 2023-12-06 DIAGNOSIS — M47816 Spondylosis without myelopathy or radiculopathy, lumbar region: Secondary | ICD-10-CM | POA: Diagnosis not present

## 2024-02-07 ENCOUNTER — Ambulatory Visit: Admitting: Radiology

## 2024-02-08 ENCOUNTER — Ambulatory Visit: Admitting: Radiology

## 2024-03-11 ENCOUNTER — Ambulatory Visit: Admitting: Radiology

## 2024-03-18 ENCOUNTER — Ambulatory Visit (INDEPENDENT_AMBULATORY_CARE_PROVIDER_SITE_OTHER): Admitting: Radiology

## 2024-03-18 ENCOUNTER — Encounter: Payer: Self-pay | Admitting: Radiology

## 2024-03-18 VITALS — BP 114/80 | HR 88 | Ht 65.0 in | Wt 164.0 lb

## 2024-03-18 DIAGNOSIS — B009 Herpesviral infection, unspecified: Secondary | ICD-10-CM

## 2024-03-18 DIAGNOSIS — Z01419 Encounter for gynecological examination (general) (routine) without abnormal findings: Secondary | ICD-10-CM | POA: Diagnosis not present

## 2024-03-18 DIAGNOSIS — Z1331 Encounter for screening for depression: Secondary | ICD-10-CM

## 2024-03-18 MED ORDER — VALACYCLOVIR HCL 500 MG PO TABS: 500.0000 mg | ORAL_TABLET | Freq: Every day | ORAL | 4 refills | Status: AC

## 2024-03-18 NOTE — Progress Notes (Signed)
 "  Jeanette Todd Jun 27, 1975 [id number]   History:  48 y.o. G1P1 presents for annual exam. Got married and retired this year. Gynecologic History Patient's last menstrual period was 03/04/2024. Period Cycle (Days):  (28-36) Period Duration (Days): spotting Period Pattern: (!) Irregular (spotting every month to 2 months with Mirena) Menstrual Flow: Light Menstrual Control: Panty liner Dysmenorrhea: (!) Mild Dysmenorrhea Symptoms: Cramping Contraception/Family planning: IUD Mirena placed 2022 Sexually active: yes Last Pap: 2024. Results were: normal Last mammogram: 1/25. Results were: normal Colonoscopy 2023  Obstetric History OB History  Gravida Para Term Preterm AB Living  1 1    1   SAB IAB Ectopic Multiple Live Births          # Outcome Date GA Lbr Len/2nd Weight Sex Type Anes PTL Lv  1 Para               03/18/2024   11:05 AM 02/06/2023   10:01 AM  Depression screen PHQ 2/9  Decreased Interest 0 0  Down, Depressed, Hopeless 0 0  PHQ - 2 Score 0 0    The following portions of the patient's history were reviewed and updated as appropriate: allergies, current medications, past family history, past medical history, past social history, past surgical history, and problem list.  Review of Systems  All other systems reviewed and are negative.   Past medical history, past surgical history, family history and social history were all reviewed and documented in the EPIC chart.  Exam:  Vitals:   03/18/24 1103  BP: 114/80  Pulse: 88  SpO2: 98%  Weight: 164 lb (74.4 kg)  Height: 5' 5 (1.651 m)   Body mass index is 27.29 kg/m.  Physical Exam Vitals and nursing note reviewed. Exam conducted with a chaperone present.  Constitutional:      Appearance: Normal appearance. She is normal weight.  HENT:     Head: Normocephalic and atraumatic.  Neck:     Thyroid: No thyroid mass, thyromegaly or thyroid tenderness.  Cardiovascular:     Rate and Rhythm: Regular rhythm.      Heart sounds: Normal heart sounds.  Pulmonary:     Effort: Pulmonary effort is normal.     Breath sounds: Normal breath sounds.  Chest:  Breasts:    Breasts are symmetrical.     Right: Normal. No inverted nipple, mass, nipple discharge, skin change or tenderness.     Left: Normal. No inverted nipple, mass, nipple discharge, skin change or tenderness.  Abdominal:     General: Abdomen is flat. Bowel sounds are normal.     Palpations: Abdomen is soft.  Genitourinary:    General: Normal vulva.     Vagina: Normal. No vaginal discharge, bleeding or lesions.     Cervix: Normal. No discharge or lesion.     Uterus: Normal. Not enlarged and not tender.      Adnexa: Right adnexa normal and left adnexa normal.       Right: No mass, tenderness or fullness.         Left: No mass, tenderness or fullness.       Rectum: No tenderness or internal hemorrhoid.  Lymphadenopathy:     Upper Body:     Right upper body: No axillary adenopathy.     Left upper body: No axillary adenopathy.  Skin:    General: Skin is warm and dry.  Neurological:     Mental Status: She is alert and oriented to person, place, and time.  Psychiatric:  Mood and Affect: Mood normal.        Thought Content: Thought content normal.        Judgment: Judgment normal.      Darice Hoit, CMA present for exam  Assessment/Plan:   1. 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  Dispense: 90 tablet; Refill: 4  3. Depression screen    Discussed SBE, pap and mammogram screening as directed/appropriate. Recommend of exercise weekly, including weight bearing exercise. Encouraged the use of sunscreen and seeing a dermatologist for a skin check.  Return in about 1 year (around 03/18/2025) for Annual.  GINETTE SHASTA NOVAK WHNP-BC 11:29 AM 03/18/2024 "

## 2024-03-25 ENCOUNTER — Other Ambulatory Visit: Payer: Self-pay | Admitting: Radiology

## 2024-03-25 DIAGNOSIS — Z1231 Encounter for screening mammogram for malignant neoplasm of breast: Secondary | ICD-10-CM

## 2024-04-09 ENCOUNTER — Ambulatory Visit
Admission: RE | Admit: 2024-04-09 | Discharge: 2024-04-09 | Disposition: A | Source: Ambulatory Visit | Attending: Radiology | Admitting: Radiology

## 2024-04-09 DIAGNOSIS — Z1231 Encounter for screening mammogram for malignant neoplasm of breast: Secondary | ICD-10-CM

## 2025-03-19 ENCOUNTER — Ambulatory Visit: Admitting: Radiology
